# Patient Record
Sex: Female | Born: 1950 | Race: Black or African American | Hispanic: No | Marital: Single | Smoking: Never smoker
Health system: Southern US, Community
[De-identification: ages and names within clinical notes are randomized; demographics above are authoritative.]

## PROBLEM LIST (undated history)

## (undated) DIAGNOSIS — I10 Essential (primary) hypertension: Secondary | ICD-10-CM

---

## 2012-02-08 ENCOUNTER — Emergency Department (HOSPITAL_COMMUNITY)
Admission: EM | Admit: 2012-02-08 | Discharge: 2012-02-08 | Disposition: A | Discharge status: Home / Self Care | Payer: BC Managed Care – PPO | Source: Home / Self Care | Attending: Emergency Medicine | Admitting: Emergency Medicine

## 2012-02-08 ENCOUNTER — Emergency Department (INDEPENDENT_AMBULATORY_CARE_PROVIDER_SITE_OTHER): Payer: BC Managed Care – PPO

## 2012-02-08 ENCOUNTER — Encounter (HOSPITAL_COMMUNITY): Payer: Self-pay | Admitting: Emergency Medicine

## 2012-02-08 DIAGNOSIS — M109 Gout, unspecified: Secondary | ICD-10-CM

## 2012-02-08 HISTORY — DX: Essential (primary) hypertension: I10

## 2012-02-08 LAB — URIC ACID: Uric Acid, Serum: 9.3 mg/dL — ABNORMAL HIGH (ref 2.4–7.0)

## 2012-02-08 MED ORDER — PREDNISONE 20 MG PO TABS: 20.0000 mg | ORAL_TABLET | Freq: Two times a day (BID) | ORAL | Status: AC

## 2012-02-08 MED ORDER — COLCHICINE 0.6 MG PO TABS: ORAL_TABLET | ORAL | Status: AC

## 2012-02-08 NOTE — ED Provider Notes (Signed)
Chief Complaint  Patient presents with  . Foot Pain    History of Present Illness:   Breanna Tanner is a 62 year old female with a two-day history of pain in the MTP joint of the right great toe. She denies any injury. She's had no fever or chills. She has no history of pain like this before or any other kind of arthritis. She has no history of gout, but there is a family history of gout. She also is taking lisinopril/hydrochlorothiazide.  Review of Systems:  Other than noted above, the patient denies any of the following symptoms: Systemic:  No fevers, chills, sweats, or aches.  No fatigue or tiredness. Musculoskeletal:  No joint pain, arthritis, bursitis, swelling, back pain, or neck pain. Neurological:  No muscular weakness, paresthesias, headache, or trouble with speech or coordination.  No dizziness.  PMFSH:  Past medical history, family history, social history, meds, and allergies were reviewed.  Physical Exam:   Vital signs:  BP 147/86  Pulse 77  Temp 97.4 F (36.3 C) (Oral)  Resp 18  SpO2 98% Gen:  Alert and oriented times 3.  In no distress. Musculoskeletal: There is swelling, redness, heat, and tenderness to palpation and movement over the MTP joint of the right great toe.  Otherwise, all joints had a full a ROM with no swelling, bruising or deformity.  No edema, pulses full. Extremities were warm and pink.  Capillary refill was brisk.  Skin:  Clear, warm and dry.  No rash. Neuro:  Alert and oriented times 3.  Muscle strength was normal.  Sensation was intact to light touch.   Radiology:  Dg Foot Complete Right  02/08/2012  *RADIOLOGY REPORT*  Clinical Data: Right foot pain and right great toe pain - swelling.  RIGHT FOOT COMPLETE - 3+ VIEW  Comparison: None  Findings: There is no evidence of fracture, subluxation or dislocation.  The Lisfranc joints are intact.  Mild to moderate degenerative changes are noted at the first and second MTP joints. No suspicious focal bony  lesions or erosive changes are identified.  IMPRESSION: No evidence of acute abnormality.  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   Original Report Authenticated By: Harmon Pier, M.D.    I reviewed the images independently and personally and concur with the radiologist's findings.  Results for orders placed during the hospital encounter of 02/08/12  URIC ACID      Component Value Range   Uric Acid, Serum 9.3 (*) 2.4 - 7.0 mg/dL   Assessment:  The encounter diagnosis was Gout attack.  Plan:   1.  The following meds were prescribed:   New Prescriptions   COLCHICINE 0.6 MG TABLET    Take 2 now and 1 in 1 hour.  May repeat dose once daily.  For gout attack.   PREDNISONE (DELTASONE) 20 MG TABLET    Take 1 tablet (20 mg total) by mouth 2 (two) times daily.   2.  The patient was instructed in symptomatic care, including rest and activity, elevation, application of ice and compression.  Appropriate handouts were given. 3.  The patient was told to return if becoming worse in any way, if no better in 3 or 4 days, and given some red flag symptoms that would indicate earlier return.   4.  The patient was told to follow up with her primary care physician with regard to her elevated uric acid level.    Reuben Likes, MD 02/08/12 [phone]

## 2012-02-08 NOTE — ED Notes (Signed)
Pt c/o right greater toe pain since yesterday Sx include: pain that radiates up to leg when walking Denies: inj/trauma, fevers, vomiting, nauseas, diarrhea No hx of gout  She is alert w/no signs of acute distress.

## 2013-05-25 ENCOUNTER — Other Ambulatory Visit: Payer: Self-pay

## 2013-05-25 ENCOUNTER — Other Ambulatory Visit (HOSPITAL_COMMUNITY)
Admission: RE | Admit: 2013-05-25 | Discharge: 2013-05-25 | Disposition: A | Discharge status: Home / Self Care | Payer: BC Managed Care – PPO | Source: Ambulatory Visit | Attending: Family Medicine | Admitting: Family Medicine

## 2013-05-25 DIAGNOSIS — Z0000 Encounter for general adult medical examination without abnormal findings: Secondary | ICD-10-CM | POA: Insufficient documentation

## 2013-06-10 ENCOUNTER — Other Ambulatory Visit: Payer: Self-pay | Admitting: Family Medicine

## 2013-06-10 DIAGNOSIS — R42 Dizziness and giddiness: Secondary | ICD-10-CM

## 2013-06-11 ENCOUNTER — Ambulatory Visit
Admission: RE | Admit: 2013-06-11 | Discharge: 2013-06-11 | Disposition: A | Discharge status: Home / Self Care | Payer: BC Managed Care – PPO | Source: Ambulatory Visit | Attending: Family Medicine | Admitting: Family Medicine

## 2013-06-11 DIAGNOSIS — R42 Dizziness and giddiness: Secondary | ICD-10-CM

## 2013-10-08 ENCOUNTER — Other Ambulatory Visit: Payer: Self-pay | Admitting: Family Medicine

## 2013-10-08 DIAGNOSIS — Z1231 Encounter for screening mammogram for malignant neoplasm of breast: Secondary | ICD-10-CM

## 2013-10-27 ENCOUNTER — Ambulatory Visit: Payer: BC Managed Care – PPO

## 2014-08-31 ENCOUNTER — Other Ambulatory Visit: Payer: Self-pay

## 2014-08-31 DIAGNOSIS — Z1231 Encounter for screening mammogram for malignant neoplasm of breast: Secondary | ICD-10-CM

## 2014-09-01 ENCOUNTER — Ambulatory Visit
Admission: RE | Admit: 2014-09-01 | Discharge: 2014-09-01 | Disposition: A | Discharge status: Home / Self Care | Payer: PRIVATE HEALTH INSURANCE | Source: Ambulatory Visit

## 2014-09-01 DIAGNOSIS — Z1231 Encounter for screening mammogram for malignant neoplasm of breast: Secondary | ICD-10-CM

## 2015-09-11 ENCOUNTER — Other Ambulatory Visit: Payer: Self-pay | Admitting: Family Medicine

## 2015-09-11 DIAGNOSIS — I6529 Occlusion and stenosis of unspecified carotid artery: Secondary | ICD-10-CM

## 2015-09-15 ENCOUNTER — Other Ambulatory Visit: Payer: PRIVATE HEALTH INSURANCE

## 2015-10-31 ENCOUNTER — Other Ambulatory Visit: Payer: Self-pay | Admitting: Family Medicine

## 2015-10-31 DIAGNOSIS — Z1231 Encounter for screening mammogram for malignant neoplasm of breast: Secondary | ICD-10-CM

## 2015-11-09 ENCOUNTER — Ambulatory Visit: Payer: PRIVATE HEALTH INSURANCE

## 2015-11-17 ENCOUNTER — Ambulatory Visit: Payer: PRIVATE HEALTH INSURANCE

## 2015-12-27 ENCOUNTER — Ambulatory Visit: Payer: PRIVATE HEALTH INSURANCE

## 2015-12-29 ENCOUNTER — Ambulatory Visit
Admission: RE | Admit: 2015-12-29 | Discharge: 2015-12-29 | Disposition: A | Discharge status: Home / Self Care | Payer: BLUE CROSS/BLUE SHIELD | Source: Ambulatory Visit | Attending: Family Medicine | Admitting: Family Medicine

## 2015-12-29 DIAGNOSIS — Z1231 Encounter for screening mammogram for malignant neoplasm of breast: Secondary | ICD-10-CM

## 2016-01-26 DIAGNOSIS — Z79899 Other long term (current) drug therapy: Secondary | ICD-10-CM | POA: Diagnosis not present

## 2016-02-15 DIAGNOSIS — I10 Essential (primary) hypertension: Secondary | ICD-10-CM | POA: Diagnosis not present

## 2016-02-15 DIAGNOSIS — N183 Chronic kidney disease, stage 3 (moderate): Secondary | ICD-10-CM | POA: Diagnosis not present

## 2016-02-15 DIAGNOSIS — E785 Hyperlipidemia, unspecified: Secondary | ICD-10-CM | POA: Diagnosis not present

## 2016-03-04 DIAGNOSIS — Q438 Other specified congenital malformations of intestine: Secondary | ICD-10-CM | POA: Diagnosis not present

## 2016-03-04 DIAGNOSIS — D126 Benign neoplasm of colon, unspecified: Secondary | ICD-10-CM | POA: Diagnosis not present

## 2016-03-04 DIAGNOSIS — K644 Residual hemorrhoidal skin tags: Secondary | ICD-10-CM | POA: Diagnosis not present

## 2016-03-04 DIAGNOSIS — Z800 Family history of malignant neoplasm of digestive organs: Secondary | ICD-10-CM | POA: Diagnosis not present

## 2016-03-04 DIAGNOSIS — Z1211 Encounter for screening for malignant neoplasm of colon: Secondary | ICD-10-CM | POA: Diagnosis not present

## 2016-03-04 DIAGNOSIS — K648 Other hemorrhoids: Secondary | ICD-10-CM | POA: Diagnosis not present

## 2016-03-07 DIAGNOSIS — Z1211 Encounter for screening for malignant neoplasm of colon: Secondary | ICD-10-CM | POA: Diagnosis not present

## 2016-03-07 DIAGNOSIS — D126 Benign neoplasm of colon, unspecified: Secondary | ICD-10-CM | POA: Diagnosis not present

## 2016-04-18 DIAGNOSIS — R7989 Other specified abnormal findings of blood chemistry: Secondary | ICD-10-CM | POA: Diagnosis not present

## 2016-04-18 DIAGNOSIS — I10 Essential (primary) hypertension: Secondary | ICD-10-CM | POA: Diagnosis not present

## 2016-04-25 DIAGNOSIS — K5900 Constipation, unspecified: Secondary | ICD-10-CM | POA: Diagnosis not present

## 2016-04-25 DIAGNOSIS — Z800 Family history of malignant neoplasm of digestive organs: Secondary | ICD-10-CM | POA: Diagnosis not present

## 2016-04-25 DIAGNOSIS — Z86010 Personal history of colonic polyps: Secondary | ICD-10-CM | POA: Diagnosis not present

## 2016-04-25 DIAGNOSIS — Z8371 Family history of colonic polyps: Secondary | ICD-10-CM | POA: Diagnosis not present

## 2016-09-02 DIAGNOSIS — M1A09X0 Idiopathic chronic gout, multiple sites, without tophus (tophi): Secondary | ICD-10-CM | POA: Diagnosis not present

## 2016-09-02 DIAGNOSIS — Z6832 Body mass index (BMI) 32.0-32.9, adult: Secondary | ICD-10-CM | POA: Diagnosis not present

## 2016-09-02 DIAGNOSIS — M2550 Pain in unspecified joint: Secondary | ICD-10-CM | POA: Diagnosis not present

## 2016-09-02 DIAGNOSIS — E669 Obesity, unspecified: Secondary | ICD-10-CM | POA: Diagnosis not present

## 2016-09-02 DIAGNOSIS — Z79899 Other long term (current) drug therapy: Secondary | ICD-10-CM | POA: Diagnosis not present

## 2016-09-19 DIAGNOSIS — R5383 Other fatigue: Secondary | ICD-10-CM | POA: Diagnosis not present

## 2016-09-19 DIAGNOSIS — I10 Essential (primary) hypertension: Secondary | ICD-10-CM | POA: Diagnosis not present

## 2016-09-19 DIAGNOSIS — E785 Hyperlipidemia, unspecified: Secondary | ICD-10-CM | POA: Diagnosis not present

## 2016-09-19 DIAGNOSIS — R634 Abnormal weight loss: Secondary | ICD-10-CM | POA: Diagnosis not present

## 2016-09-19 DIAGNOSIS — Z0000 Encounter for general adult medical examination without abnormal findings: Secondary | ICD-10-CM | POA: Diagnosis not present

## 2016-09-19 DIAGNOSIS — R195 Other fecal abnormalities: Secondary | ICD-10-CM | POA: Diagnosis not present

## 2016-09-19 DIAGNOSIS — R430 Anosmia: Secondary | ICD-10-CM | POA: Diagnosis not present

## 2016-09-19 DIAGNOSIS — Z800 Family history of malignant neoplasm of digestive organs: Secondary | ICD-10-CM | POA: Diagnosis not present

## 2016-10-17 DIAGNOSIS — K5900 Constipation, unspecified: Secondary | ICD-10-CM | POA: Diagnosis not present

## 2016-10-17 DIAGNOSIS — K9289 Other specified diseases of the digestive system: Secondary | ICD-10-CM | POA: Diagnosis not present

## 2016-10-17 DIAGNOSIS — R430 Anosmia: Secondary | ICD-10-CM | POA: Diagnosis not present

## 2016-12-06 ENCOUNTER — Other Ambulatory Visit: Payer: Self-pay | Admitting: Family Medicine

## 2016-12-06 DIAGNOSIS — Z1231 Encounter for screening mammogram for malignant neoplasm of breast: Secondary | ICD-10-CM

## 2017-01-03 ENCOUNTER — Ambulatory Visit
Admission: RE | Admit: 2017-01-03 | Discharge: 2017-01-03 | Disposition: A | Discharge status: Home / Self Care | Payer: Medicare Other | Source: Ambulatory Visit | Attending: Family Medicine | Admitting: Family Medicine

## 2017-01-03 DIAGNOSIS — Z1231 Encounter for screening mammogram for malignant neoplasm of breast: Secondary | ICD-10-CM | POA: Diagnosis not present

## 2017-03-05 DIAGNOSIS — M1A09X0 Idiopathic chronic gout, multiple sites, without tophus (tophi): Secondary | ICD-10-CM | POA: Diagnosis not present

## 2017-03-05 DIAGNOSIS — Z6832 Body mass index (BMI) 32.0-32.9, adult: Secondary | ICD-10-CM | POA: Diagnosis not present

## 2017-03-05 DIAGNOSIS — Z79899 Other long term (current) drug therapy: Secondary | ICD-10-CM | POA: Diagnosis not present

## 2017-03-05 DIAGNOSIS — E669 Obesity, unspecified: Secondary | ICD-10-CM | POA: Diagnosis not present

## 2017-03-05 DIAGNOSIS — M2550 Pain in unspecified joint: Secondary | ICD-10-CM | POA: Diagnosis not present

## 2017-03-18 DIAGNOSIS — H43813 Vitreous degeneration, bilateral: Secondary | ICD-10-CM | POA: Diagnosis not present

## 2017-03-18 DIAGNOSIS — H35363 Drusen (degenerative) of macula, bilateral: Secondary | ICD-10-CM | POA: Diagnosis not present

## 2017-03-18 DIAGNOSIS — H5347 Heteronymous bilateral field defects: Secondary | ICD-10-CM | POA: Diagnosis not present

## 2017-03-24 DIAGNOSIS — G43109 Migraine with aura, not intractable, without status migrainosus: Secondary | ICD-10-CM | POA: Diagnosis not present

## 2017-04-17 DIAGNOSIS — E785 Hyperlipidemia, unspecified: Secondary | ICD-10-CM | POA: Diagnosis not present

## 2017-04-17 DIAGNOSIS — M25552 Pain in left hip: Secondary | ICD-10-CM | POA: Diagnosis not present

## 2017-04-17 DIAGNOSIS — N183 Chronic kidney disease, stage 3 (moderate): Secondary | ICD-10-CM | POA: Diagnosis not present

## 2017-04-17 DIAGNOSIS — I10 Essential (primary) hypertension: Secondary | ICD-10-CM | POA: Diagnosis not present

## 2017-05-27 DIAGNOSIS — R7989 Other specified abnormal findings of blood chemistry: Secondary | ICD-10-CM | POA: Diagnosis not present

## 2017-05-29 DIAGNOSIS — I129 Hypertensive chronic kidney disease with stage 1 through stage 4 chronic kidney disease, or unspecified chronic kidney disease: Secondary | ICD-10-CM | POA: Diagnosis not present

## 2017-05-29 DIAGNOSIS — N183 Chronic kidney disease, stage 3 (moderate): Secondary | ICD-10-CM | POA: Diagnosis not present

## 2017-08-07 DIAGNOSIS — H8110 Benign paroxysmal vertigo, unspecified ear: Secondary | ICD-10-CM | POA: Diagnosis not present

## 2017-08-07 DIAGNOSIS — R58 Hemorrhage, not elsewhere classified: Secondary | ICD-10-CM | POA: Diagnosis not present

## 2017-09-29 DIAGNOSIS — Z124 Encounter for screening for malignant neoplasm of cervix: Secondary | ICD-10-CM | POA: Diagnosis not present

## 2017-09-29 DIAGNOSIS — Z0000 Encounter for general adult medical examination without abnormal findings: Secondary | ICD-10-CM | POA: Diagnosis not present

## 2017-09-29 DIAGNOSIS — N183 Chronic kidney disease, stage 3 (moderate): Secondary | ICD-10-CM | POA: Diagnosis not present

## 2017-09-29 DIAGNOSIS — E785 Hyperlipidemia, unspecified: Secondary | ICD-10-CM | POA: Diagnosis not present

## 2017-09-29 DIAGNOSIS — B373 Candidiasis of vulva and vagina: Secondary | ICD-10-CM | POA: Diagnosis not present

## 2017-09-29 DIAGNOSIS — M109 Gout, unspecified: Secondary | ICD-10-CM | POA: Diagnosis not present

## 2017-09-29 DIAGNOSIS — I10 Essential (primary) hypertension: Secondary | ICD-10-CM | POA: Diagnosis not present

## 2017-09-29 DIAGNOSIS — R350 Frequency of micturition: Secondary | ICD-10-CM | POA: Diagnosis not present

## 2017-09-29 DIAGNOSIS — Z113 Encounter for screening for infections with a predominantly sexual mode of transmission: Secondary | ICD-10-CM | POA: Diagnosis not present

## 2017-09-29 DIAGNOSIS — Z8639 Personal history of other endocrine, nutritional and metabolic disease: Secondary | ICD-10-CM | POA: Diagnosis not present

## 2017-10-02 DIAGNOSIS — M2550 Pain in unspecified joint: Secondary | ICD-10-CM | POA: Diagnosis not present

## 2017-10-02 DIAGNOSIS — Z6833 Body mass index (BMI) 33.0-33.9, adult: Secondary | ICD-10-CM | POA: Diagnosis not present

## 2017-10-02 DIAGNOSIS — E669 Obesity, unspecified: Secondary | ICD-10-CM | POA: Diagnosis not present

## 2017-10-02 DIAGNOSIS — M1A09X0 Idiopathic chronic gout, multiple sites, without tophus (tophi): Secondary | ICD-10-CM | POA: Diagnosis not present

## 2017-10-02 DIAGNOSIS — Z79899 Other long term (current) drug therapy: Secondary | ICD-10-CM | POA: Diagnosis not present

## 2017-10-14 DIAGNOSIS — R319 Hematuria, unspecified: Secondary | ICD-10-CM | POA: Diagnosis not present

## 2017-10-14 DIAGNOSIS — N390 Urinary tract infection, site not specified: Secondary | ICD-10-CM | POA: Diagnosis not present

## 2017-10-23 DIAGNOSIS — M109 Gout, unspecified: Secondary | ICD-10-CM | POA: Diagnosis not present

## 2017-10-23 DIAGNOSIS — N183 Chronic kidney disease, stage 3 (moderate): Secondary | ICD-10-CM | POA: Diagnosis not present

## 2017-10-23 DIAGNOSIS — E785 Hyperlipidemia, unspecified: Secondary | ICD-10-CM | POA: Diagnosis not present

## 2017-10-23 DIAGNOSIS — I10 Essential (primary) hypertension: Secondary | ICD-10-CM | POA: Diagnosis not present

## 2017-11-24 ENCOUNTER — Other Ambulatory Visit: Payer: Self-pay | Admitting: Family Medicine

## 2017-11-24 DIAGNOSIS — Z1231 Encounter for screening mammogram for malignant neoplasm of breast: Secondary | ICD-10-CM

## 2017-12-01 DIAGNOSIS — N898 Other specified noninflammatory disorders of vagina: Secondary | ICD-10-CM | POA: Diagnosis not present

## 2018-01-05 ENCOUNTER — Ambulatory Visit: Payer: 59

## 2018-01-05 DIAGNOSIS — B349 Viral infection, unspecified: Secondary | ICD-10-CM | POA: Diagnosis not present

## 2018-05-18 ENCOUNTER — Other Ambulatory Visit: Payer: Self-pay

## 2018-05-18 ENCOUNTER — Ambulatory Visit
Admission: RE | Admit: 2018-05-18 | Discharge: 2018-05-18 | Disposition: A | Discharge status: Home / Self Care | Payer: Medicare Other | Source: Ambulatory Visit | Attending: Family Medicine | Admitting: Family Medicine

## 2018-05-18 ENCOUNTER — Other Ambulatory Visit: Payer: Self-pay | Admitting: Family Medicine

## 2018-05-18 ENCOUNTER — Other Ambulatory Visit: Payer: 59

## 2018-05-18 DIAGNOSIS — T189XXA Foreign body of alimentary tract, part unspecified, initial encounter: Secondary | ICD-10-CM

## 2018-05-18 DIAGNOSIS — T17900A Unspecified foreign body in respiratory tract, part unspecified causing asphyxiation, initial encounter: Secondary | ICD-10-CM

## 2018-05-18 DIAGNOSIS — T17908A Unspecified foreign body in respiratory tract, part unspecified causing other injury, initial encounter: Secondary | ICD-10-CM

## 2018-10-06 ENCOUNTER — Other Ambulatory Visit: Payer: Self-pay

## 2018-10-06 ENCOUNTER — Other Ambulatory Visit: Payer: Self-pay | Admitting: Obstetrics and Gynecology

## 2018-10-06 ENCOUNTER — Other Ambulatory Visit (HOSPITAL_COMMUNITY)
Admission: RE | Admit: 2018-10-06 | Discharge: 2018-10-06 | Disposition: A | Discharge status: Home / Self Care | Payer: Medicare Other | Source: Ambulatory Visit | Attending: Obstetrics and Gynecology | Admitting: Obstetrics and Gynecology

## 2018-10-06 DIAGNOSIS — E2839 Other primary ovarian failure: Secondary | ICD-10-CM

## 2018-10-06 DIAGNOSIS — Z124 Encounter for screening for malignant neoplasm of cervix: Secondary | ICD-10-CM | POA: Insufficient documentation

## 2018-10-06 DIAGNOSIS — Z1151 Encounter for screening for human papillomavirus (HPV): Secondary | ICD-10-CM | POA: Insufficient documentation

## 2018-10-11 LAB — CYTOLOGY - PAP
Diagnosis: NEGATIVE
High risk HPV: NEGATIVE
Molecular Disclaimer: 56
Molecular Disclaimer: DETECTED
Molecular Disclaimer: NORMAL

## 2018-12-25 ENCOUNTER — Ambulatory Visit
Admission: RE | Admit: 2018-12-25 | Discharge: 2018-12-25 | Disposition: A | Discharge status: Home / Self Care | Payer: Medicare Other | Source: Ambulatory Visit | Attending: Family Medicine | Admitting: Family Medicine

## 2018-12-25 ENCOUNTER — Other Ambulatory Visit: Payer: Self-pay

## 2018-12-25 ENCOUNTER — Ambulatory Visit
Admission: RE | Admit: 2018-12-25 | Discharge: 2018-12-25 | Disposition: A | Discharge status: Home / Self Care | Payer: Medicare Other | Source: Ambulatory Visit | Attending: Obstetrics and Gynecology | Admitting: Obstetrics and Gynecology

## 2018-12-25 DIAGNOSIS — E2839 Other primary ovarian failure: Secondary | ICD-10-CM

## 2018-12-25 DIAGNOSIS — Z1231 Encounter for screening mammogram for malignant neoplasm of breast: Secondary | ICD-10-CM

## 2018-12-28 ENCOUNTER — Other Ambulatory Visit: Payer: Self-pay | Admitting: Family Medicine

## 2018-12-28 ENCOUNTER — Other Ambulatory Visit: Payer: Self-pay | Admitting: Obstetrics and Gynecology

## 2018-12-28 DIAGNOSIS — R928 Other abnormal and inconclusive findings on diagnostic imaging of breast: Secondary | ICD-10-CM

## 2019-01-08 ENCOUNTER — Other Ambulatory Visit: Payer: Self-pay | Admitting: Obstetrics and Gynecology

## 2019-01-08 ENCOUNTER — Ambulatory Visit
Admission: RE | Admit: 2019-01-08 | Discharge: 2019-01-08 | Disposition: A | Discharge status: Home / Self Care | Payer: PRIVATE HEALTH INSURANCE | Source: Ambulatory Visit | Attending: Family Medicine | Admitting: Family Medicine

## 2019-01-08 ENCOUNTER — Other Ambulatory Visit: Payer: Self-pay

## 2019-01-08 DIAGNOSIS — R928 Other abnormal and inconclusive findings on diagnostic imaging of breast: Secondary | ICD-10-CM

## 2019-01-08 DIAGNOSIS — N6320 Unspecified lump in the left breast, unspecified quadrant: Secondary | ICD-10-CM

## 2019-01-13 ENCOUNTER — Other Ambulatory Visit: Payer: Self-pay

## 2019-01-13 ENCOUNTER — Ambulatory Visit
Admission: RE | Admit: 2019-01-13 | Discharge: 2019-01-13 | Disposition: A | Discharge status: Home / Self Care | Payer: PRIVATE HEALTH INSURANCE | Source: Ambulatory Visit | Attending: Obstetrics and Gynecology | Admitting: Obstetrics and Gynecology

## 2019-01-13 ENCOUNTER — Other Ambulatory Visit (HOSPITAL_COMMUNITY)
Admission: RE | Admit: 2019-01-13 | Discharge: 2019-01-13 | Disposition: A | Discharge status: Home / Self Care | Payer: PRIVATE HEALTH INSURANCE | Source: Ambulatory Visit | Attending: Obstetrics and Gynecology | Admitting: Obstetrics and Gynecology

## 2019-01-13 ENCOUNTER — Other Ambulatory Visit: Payer: Self-pay | Admitting: Radiology

## 2019-01-13 DIAGNOSIS — N6320 Unspecified lump in the left breast, unspecified quadrant: Secondary | ICD-10-CM

## 2019-01-19 LAB — SURGICAL PATHOLOGY

## 2020-05-30 ENCOUNTER — Other Ambulatory Visit: Payer: Self-pay | Admitting: Nurse Practitioner

## 2020-05-30 DIAGNOSIS — Z1231 Encounter for screening mammogram for malignant neoplasm of breast: Secondary | ICD-10-CM

## 2020-08-04 DIAGNOSIS — C8290 Follicular lymphoma, unspecified, unspecified site: Secondary | ICD-10-CM | POA: Diagnosis not present

## 2020-08-04 DIAGNOSIS — C8339 Diffuse large B-cell lymphoma, extranodal and solid organ sites: Secondary | ICD-10-CM | POA: Diagnosis not present

## 2020-08-04 DIAGNOSIS — R590 Localized enlarged lymph nodes: Secondary | ICD-10-CM | POA: Diagnosis not present

## 2020-08-07 DIAGNOSIS — Z7189 Other specified counseling: Secondary | ICD-10-CM | POA: Diagnosis not present

## 2020-08-07 DIAGNOSIS — C8290 Follicular lymphoma, unspecified, unspecified site: Secondary | ICD-10-CM | POA: Diagnosis not present

## 2020-08-07 DIAGNOSIS — E538 Deficiency of other specified B group vitamins: Secondary | ICD-10-CM | POA: Diagnosis not present

## 2020-08-07 DIAGNOSIS — C8230 Follicular lymphoma grade IIIa, unspecified site: Secondary | ICD-10-CM | POA: Diagnosis not present

## 2020-08-11 DIAGNOSIS — C8291 Follicular lymphoma, unspecified, lymph nodes of head, face, and neck: Secondary | ICD-10-CM | POA: Diagnosis not present

## 2020-08-11 DIAGNOSIS — R590 Localized enlarged lymph nodes: Secondary | ICD-10-CM | POA: Diagnosis not present

## 2020-09-04 DIAGNOSIS — Z888 Allergy status to other drugs, medicaments and biological substances status: Secondary | ICD-10-CM | POA: Diagnosis not present

## 2020-09-04 DIAGNOSIS — Z79899 Other long term (current) drug therapy: Secondary | ICD-10-CM | POA: Diagnosis not present

## 2020-09-04 DIAGNOSIS — M109 Gout, unspecified: Secondary | ICD-10-CM | POA: Diagnosis not present

## 2020-09-04 DIAGNOSIS — E538 Deficiency of other specified B group vitamins: Secondary | ICD-10-CM | POA: Diagnosis not present

## 2020-09-04 DIAGNOSIS — Z7189 Other specified counseling: Secondary | ICD-10-CM | POA: Diagnosis not present

## 2020-09-04 DIAGNOSIS — Z9842 Cataract extraction status, left eye: Secondary | ICD-10-CM | POA: Diagnosis not present

## 2020-09-04 DIAGNOSIS — C8230 Follicular lymphoma grade IIIa, unspecified site: Secondary | ICD-10-CM | POA: Diagnosis not present

## 2020-09-04 DIAGNOSIS — Z9841 Cataract extraction status, right eye: Secondary | ICD-10-CM | POA: Diagnosis not present

## 2020-09-21 DIAGNOSIS — E538 Deficiency of other specified B group vitamins: Secondary | ICD-10-CM | POA: Diagnosis not present

## 2020-09-21 DIAGNOSIS — C8210 Follicular lymphoma grade II, unspecified site: Secondary | ICD-10-CM | POA: Diagnosis not present

## 2020-10-16 DIAGNOSIS — D72819 Decreased white blood cell count, unspecified: Secondary | ICD-10-CM | POA: Diagnosis not present

## 2020-10-16 DIAGNOSIS — C8294 Follicular lymphoma, unspecified, lymph nodes of axilla and upper limb: Secondary | ICD-10-CM | POA: Diagnosis not present

## 2020-10-23 DIAGNOSIS — C8290 Follicular lymphoma, unspecified, unspecified site: Secondary | ICD-10-CM | POA: Diagnosis not present

## 2020-10-26 DIAGNOSIS — C8291 Follicular lymphoma, unspecified, lymph nodes of head, face, and neck: Secondary | ICD-10-CM | POA: Diagnosis not present

## 2020-11-30 DIAGNOSIS — K635 Polyp of colon: Secondary | ICD-10-CM | POA: Diagnosis not present

## 2020-11-30 DIAGNOSIS — D123 Benign neoplasm of transverse colon: Secondary | ICD-10-CM | POA: Diagnosis not present

## 2020-11-30 DIAGNOSIS — Z800 Family history of malignant neoplasm of digestive organs: Secondary | ICD-10-CM | POA: Diagnosis not present

## 2020-11-30 DIAGNOSIS — Z86010 Personal history of colonic polyps: Secondary | ICD-10-CM | POA: Diagnosis not present

## 2020-11-30 DIAGNOSIS — K649 Unspecified hemorrhoids: Secondary | ICD-10-CM | POA: Diagnosis not present

## 2020-12-04 IMAGING — CR ABDOMEN - 1 VIEW
1 series · 1 of 1 positions shown · non-contrast
Comparison: None.

CLINICAL DATA: Swallowed bridge, difficulty swallowing at times

EXAM:
ABDOMEN - 1 VIEW

[t abdomen supine]
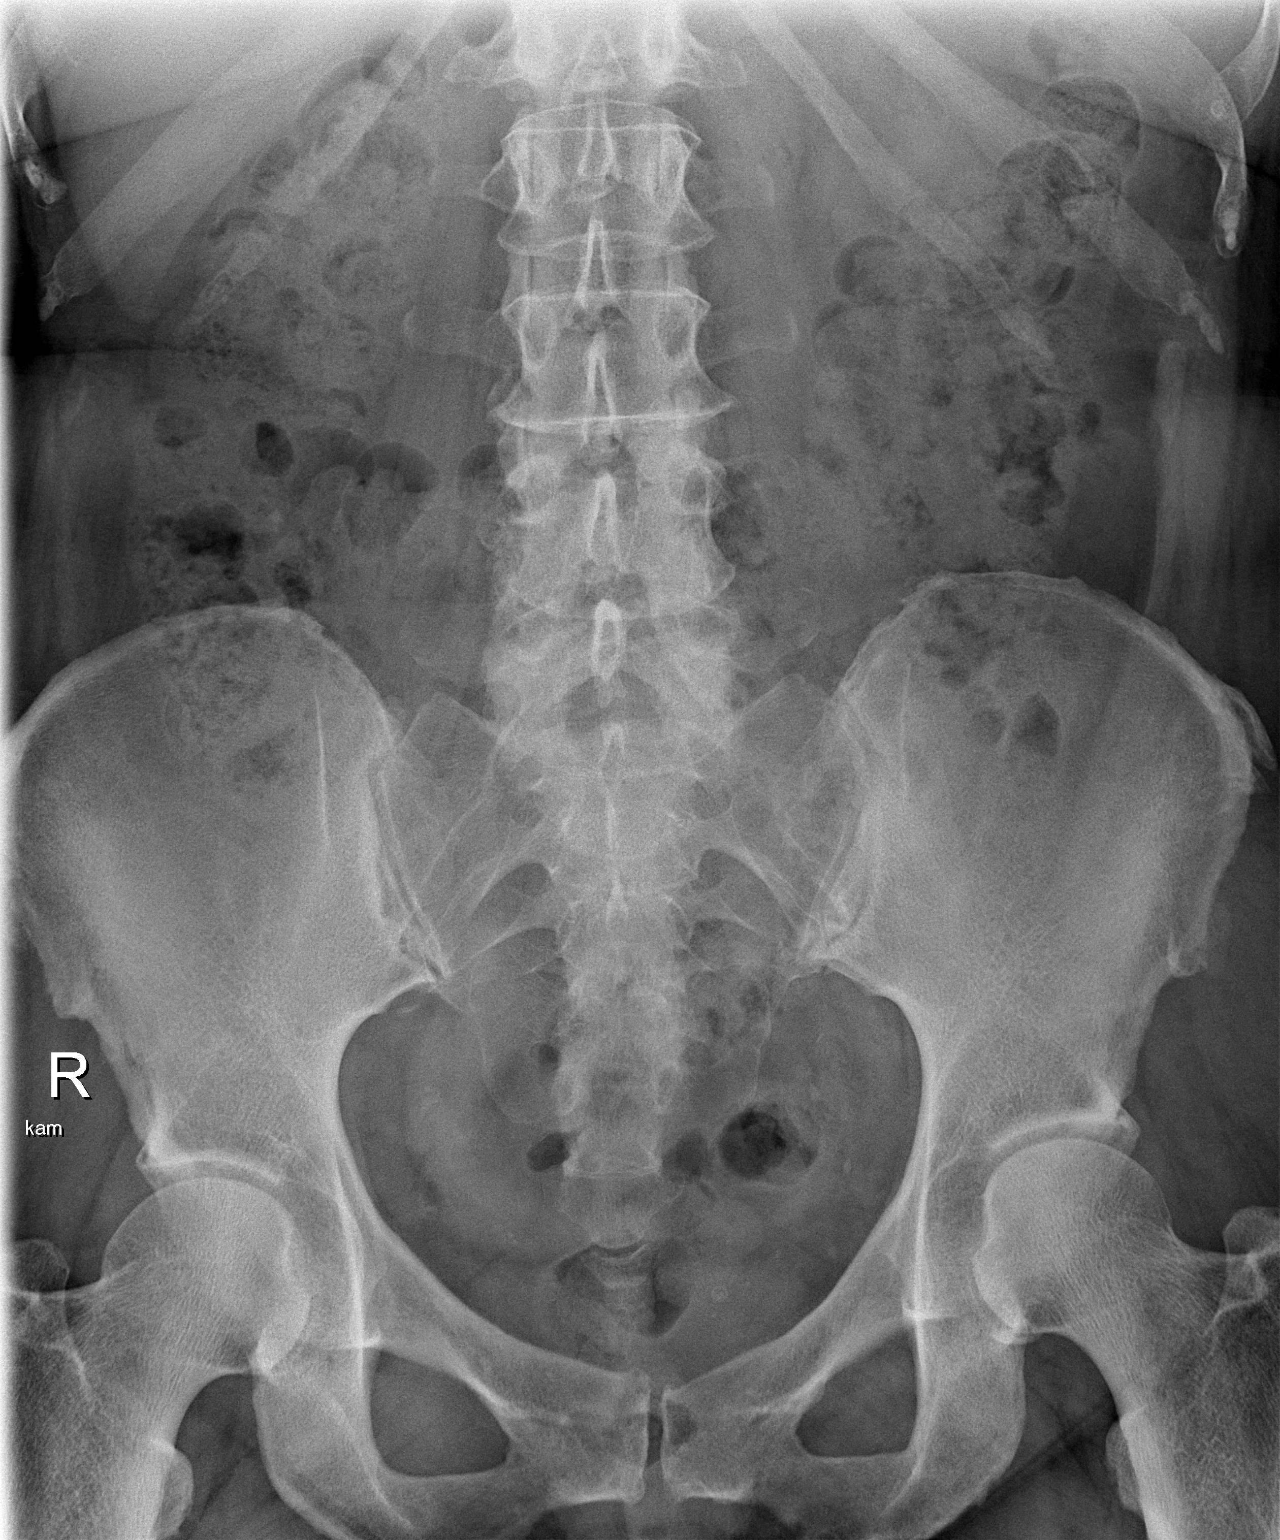

[1 of 1 positions shown; findings below may reference images not displayed]

FINDINGS: The bowel gas pattern is normal. No radio-opaque calculi or other
significant radiographic abnormality are seen.
IMPRESSION: Negative.

## 2020-12-04 IMAGING — CR CHEST - 2 VIEW
2 series · 2 of 2 positions shown · non-contrast
Comparison: None.

CLINICAL DATA: Possibly swallowed a piece of her bridge.

EXAM:
CHEST - 2 VIEW

[w chest pa]
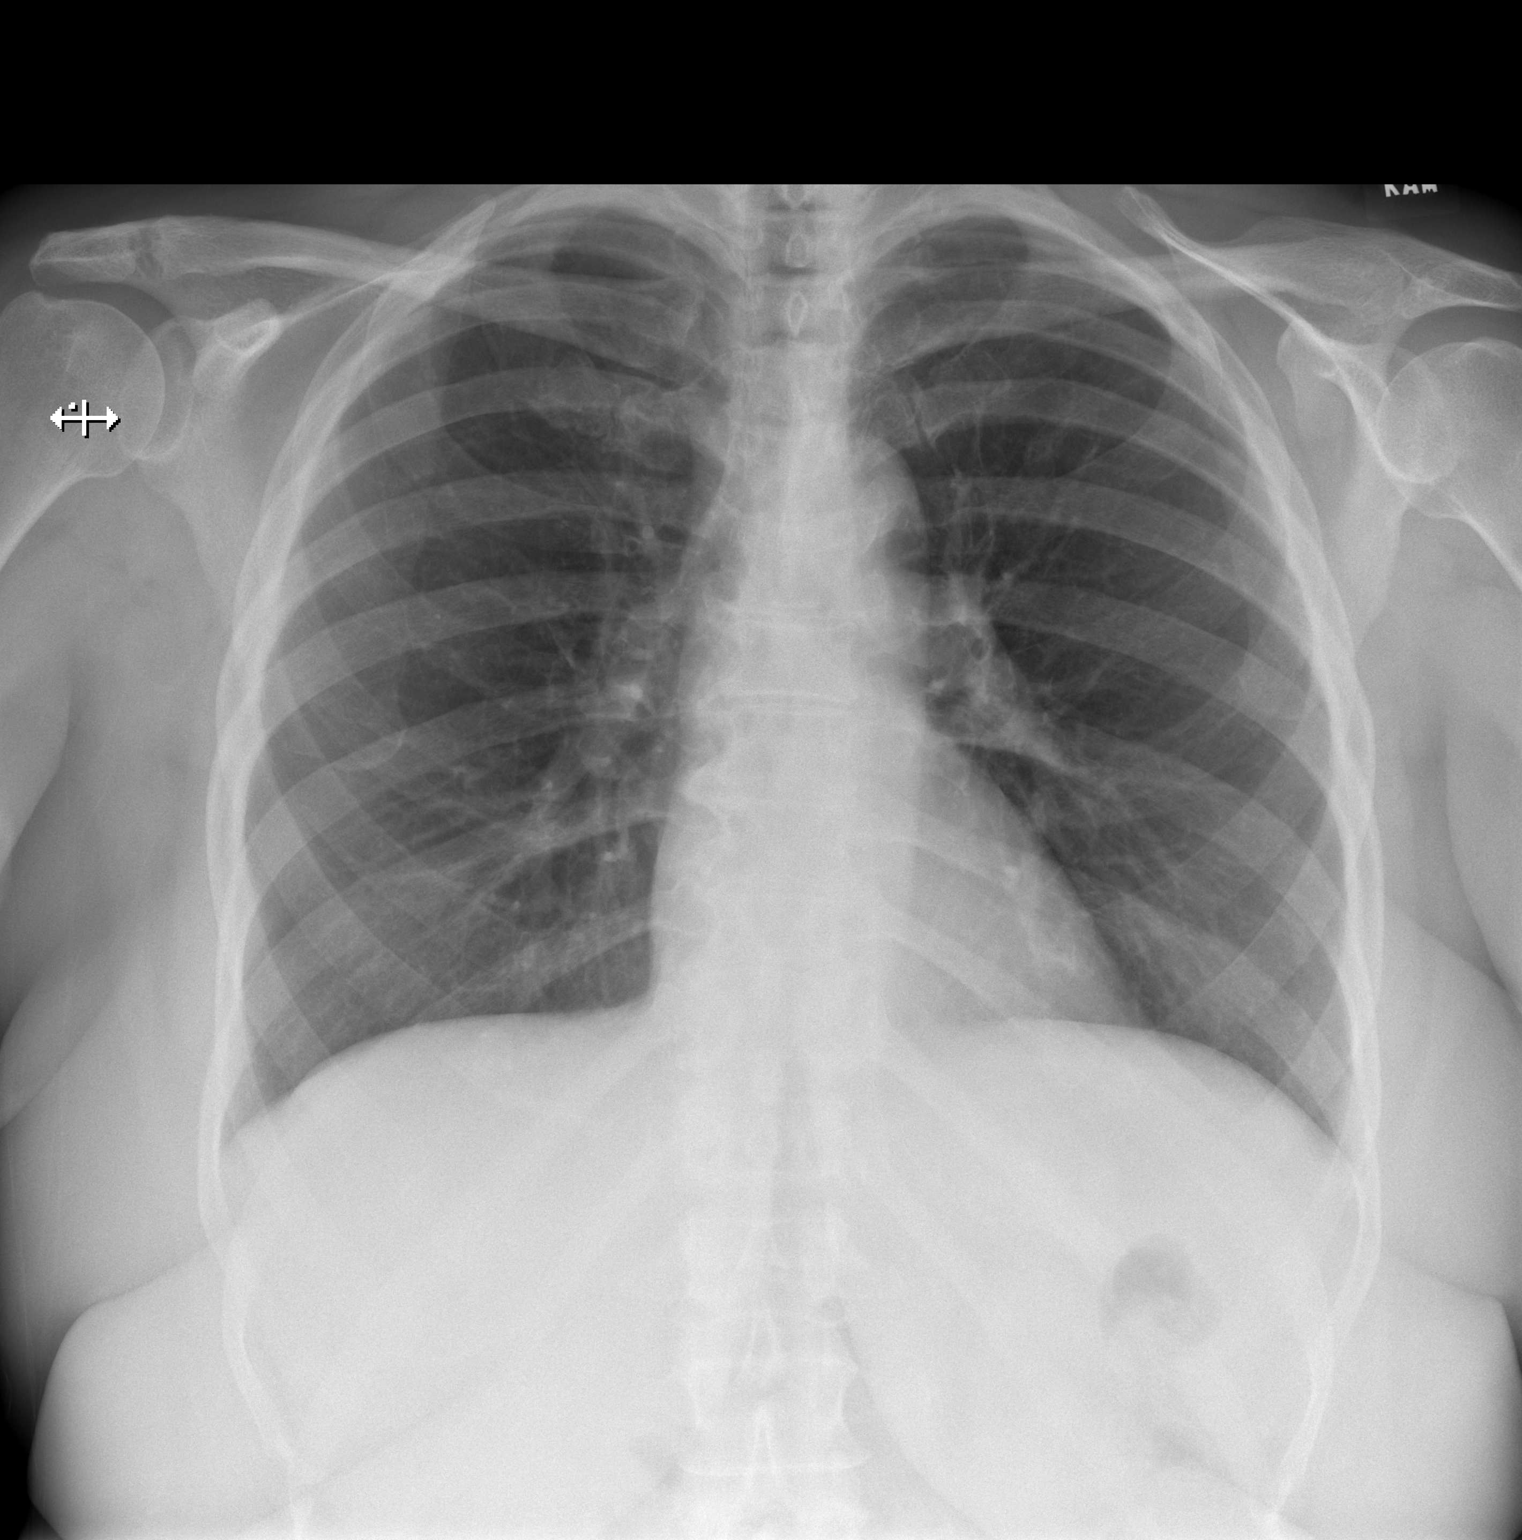

[w chest lat]
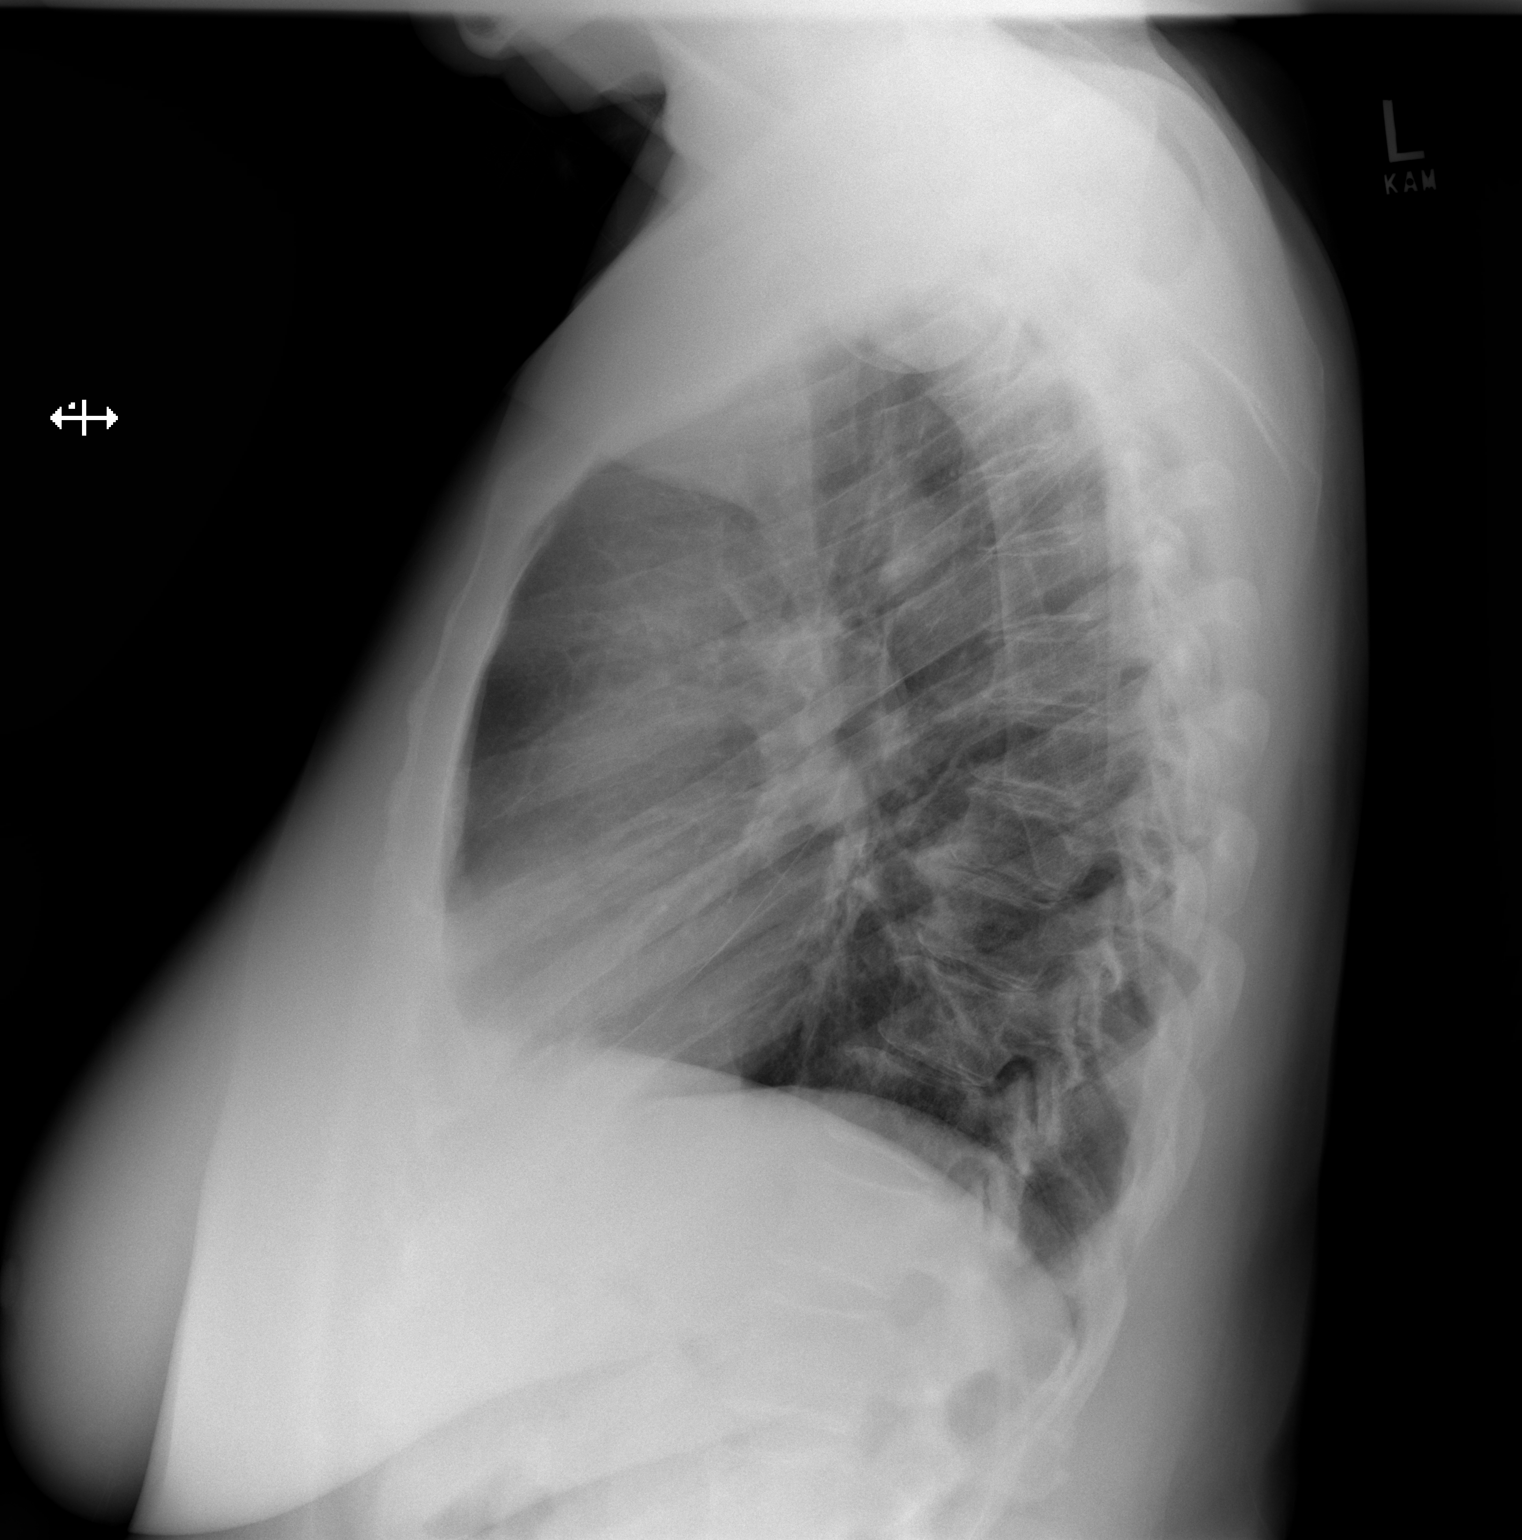

[2 of 2 positions shown; findings below may reference images not displayed]

FINDINGS: The heart size and mediastinal contours are within normal limits.
Normal pulmonary vascularity. No focal consolidation, pleural
effusion, or pneumothorax. No acute osseous abnormality. No
radiopaque foreign body identified.
IMPRESSION: No active cardiopulmonary disease.

## 2021-01-03 DIAGNOSIS — I10 Essential (primary) hypertension: Secondary | ICD-10-CM | POA: Diagnosis not present

## 2021-01-04 DIAGNOSIS — H26493 Other secondary cataract, bilateral: Secondary | ICD-10-CM | POA: Diagnosis not present

## 2021-01-10 DIAGNOSIS — C8210 Follicular lymphoma grade II, unspecified site: Secondary | ICD-10-CM | POA: Diagnosis not present

## 2021-01-10 DIAGNOSIS — Z79899 Other long term (current) drug therapy: Secondary | ICD-10-CM | POA: Diagnosis not present

## 2021-01-10 DIAGNOSIS — N63 Unspecified lump in unspecified breast: Secondary | ICD-10-CM | POA: Diagnosis not present

## 2021-01-10 DIAGNOSIS — E538 Deficiency of other specified B group vitamins: Secondary | ICD-10-CM | POA: Diagnosis not present

## 2021-01-10 DIAGNOSIS — C8290 Follicular lymphoma, unspecified, unspecified site: Secondary | ICD-10-CM | POA: Diagnosis not present

## 2021-01-10 DIAGNOSIS — C8289 Other types of follicular lymphoma, extranodal and solid organ sites: Secondary | ICD-10-CM | POA: Diagnosis not present

## 2021-08-01 IMAGING — US US BREAST BX W LOC DEV 1ST LESION IMG BX SPEC US GUIDE*L*
1 series · 12 of 19 positions shown · non-contrast
Comparison: Previous exam(s).
COMPARISON: Previous exam(s).

Addendum:
CLINICAL DATA: Biopsy of a left breast mass

EXAM:
ULTRASOUND GUIDED LEFT BREAST CORE NEEDLE BIOPSY

[Series 1: us breast bx w loc dev 1st lesion img bx spec us g · 0.07mm/px · 12 of 19 slices shown]
[im 1/19]
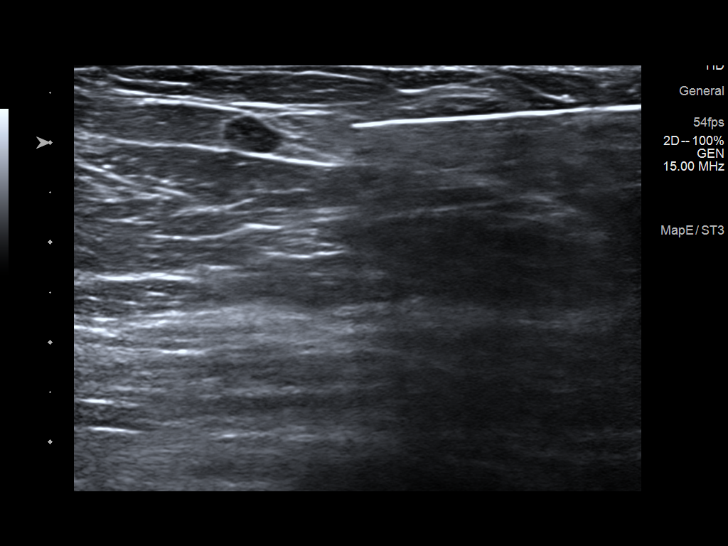
[im 3/19]
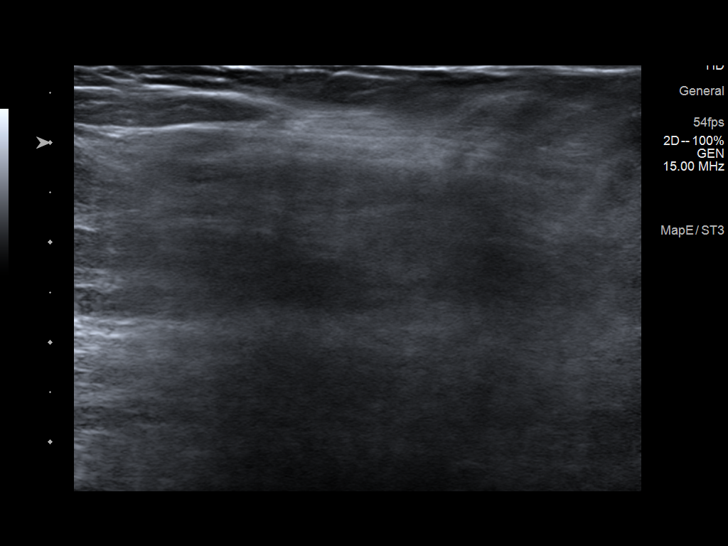
[im 4/19]
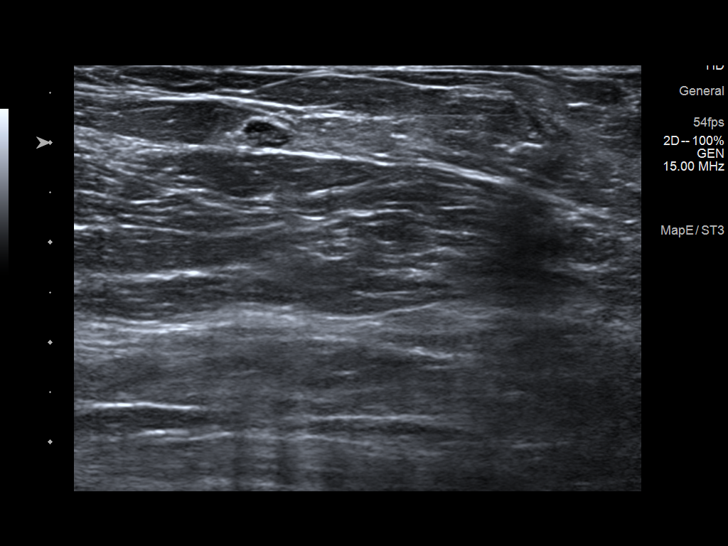
[im 6/19]
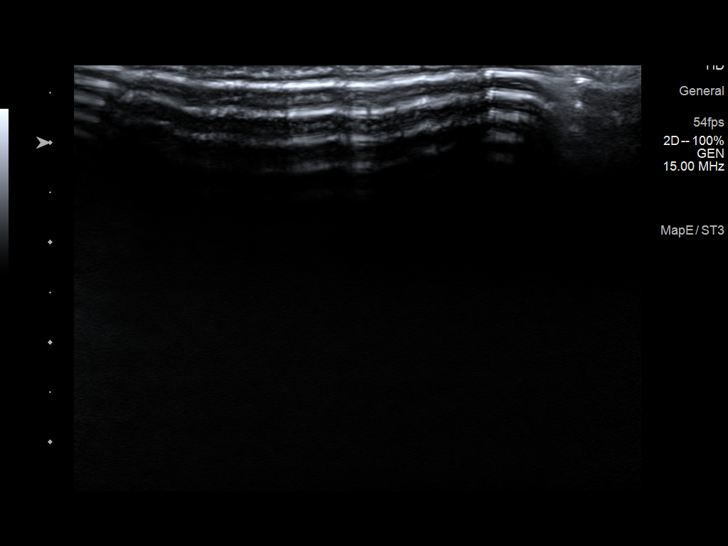
[im 8/19]
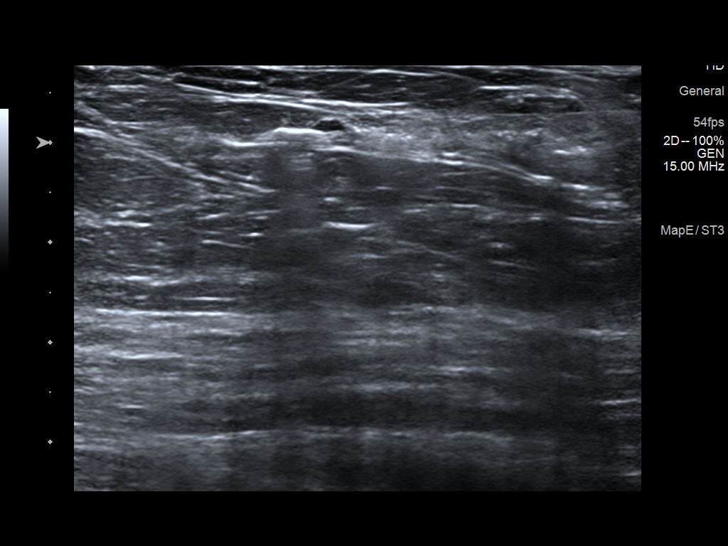
[im 9/19]
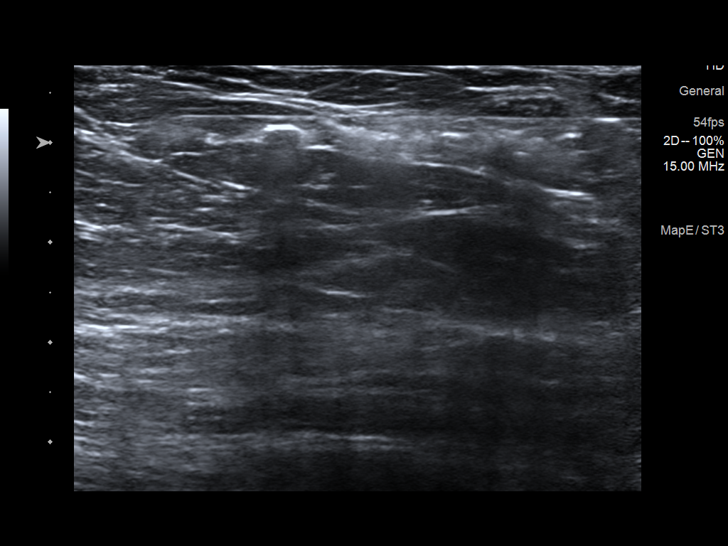
[im 11/19]
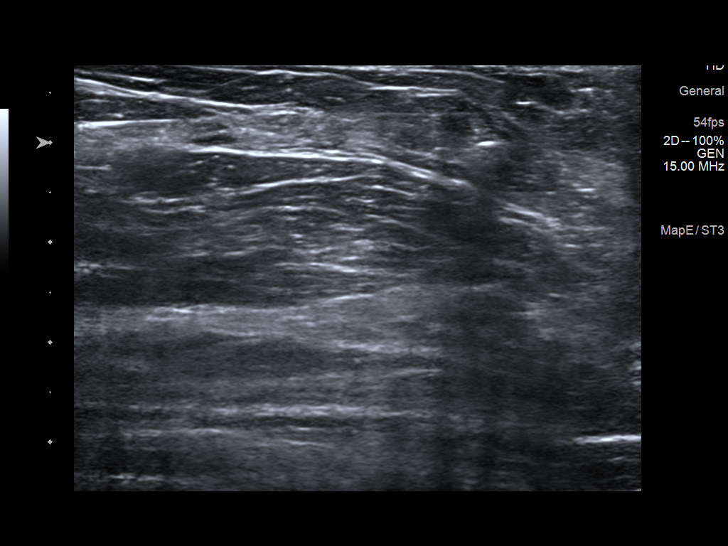
[im 12/19]
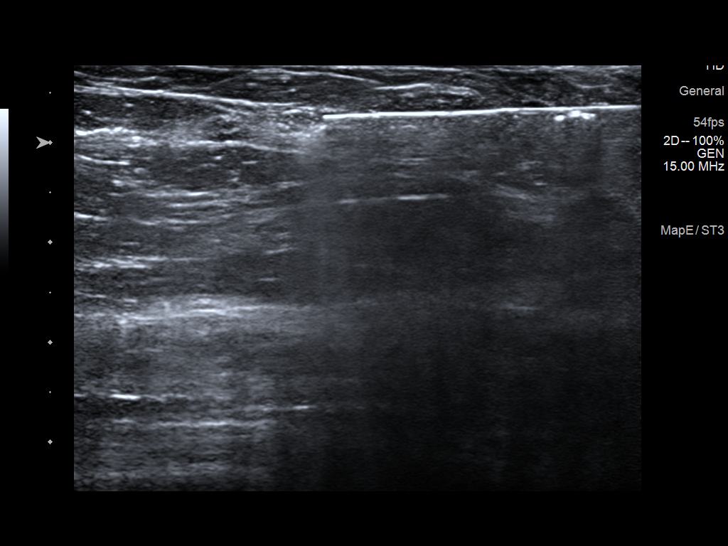
[im 14/19]
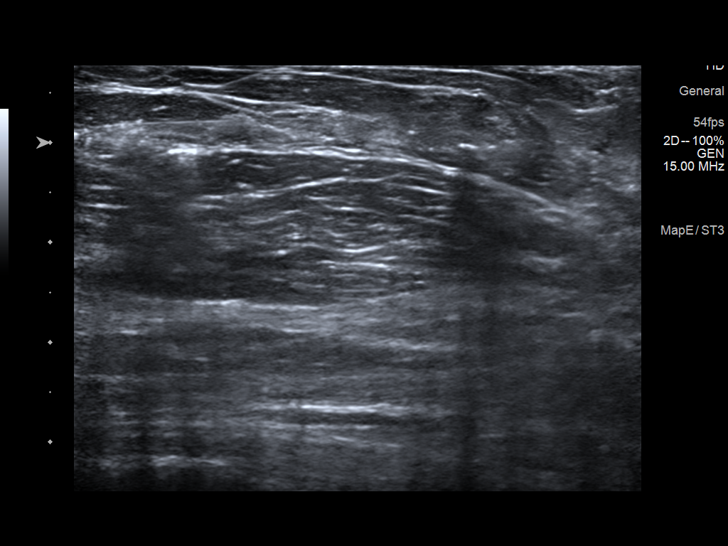
[im 16/19]
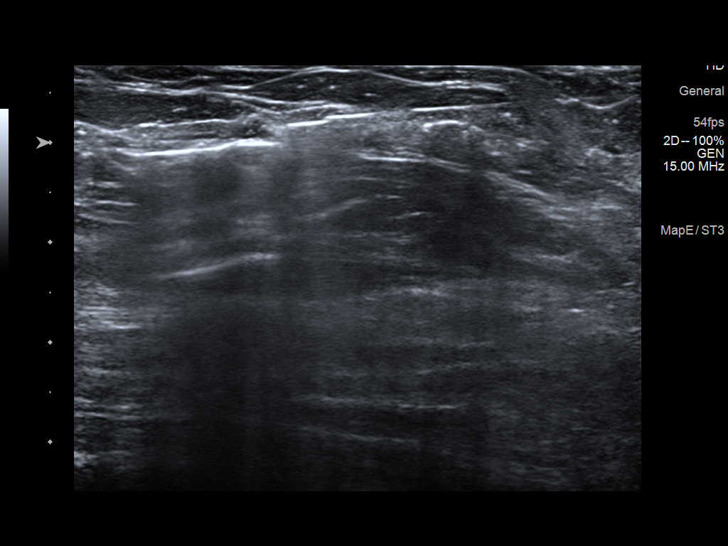
[im 17/19]
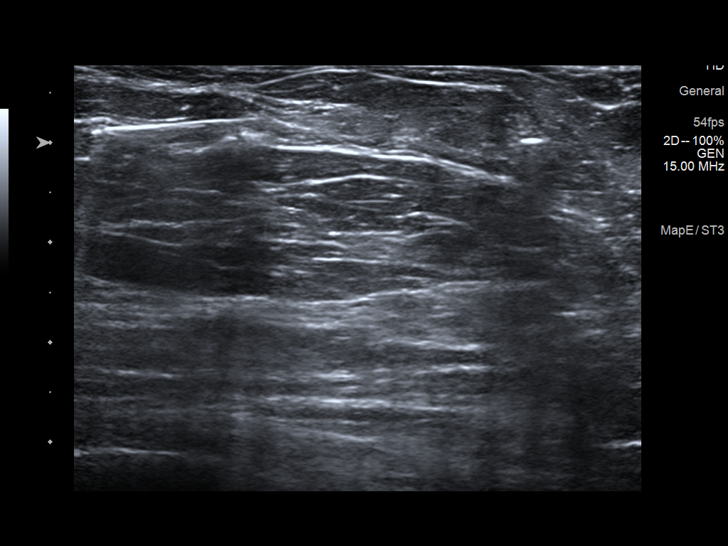
[im 19/19]
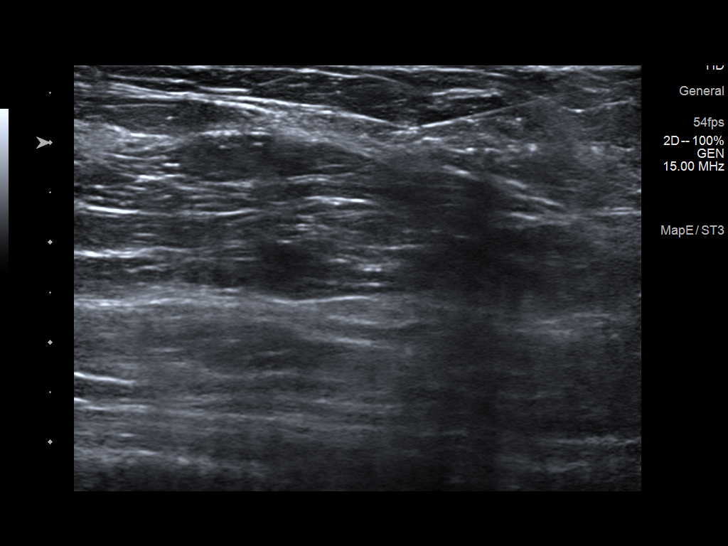

[12 of 19 positions shown; findings below may reference images not displayed]



Lesion quadrant: 3 o'clock

Using sterile technique and 1% Lidocaine as local anesthetic, under
direct ultrasound visualization, a 12 gauge Paulus N device was
used to perform biopsy of a left breast mass at 3 o'clock using a
lateral approach. At the conclusion of the procedure ribbon shaped
tissue marker clip was deployed into the biopsy cavity. Follow up 2
view mammogram was performed and dictated separately.
IMPRESSION: Ultrasound guided biopsy of a left breast mass at 3 o'clock. No
apparent complications.

ADDENDUM:
Pathology revealed FOLLICULAR LYMPHOMA of the Left breast, 3
o'clock. While the overall appearance is low grade, the increased
number of large cells (>15/hpf) in some areas raise the possibility
of a higher grade process. Additional tissue may be helpful in this
regard.

Flow Pathology revealed monoclonal B-cell population identified. The
findings are most consistent with non-Hodgkin B-cell lymphoma and
correlate well with the morphologic features of follicular lymphoma
seen in the lymph node. This was found to be concordant by Dr. Hugo-Cristopher
M[place].

Pathology results were discussed with the patient by Dr. Cai
Maryuri at [facility] on January 19, 2019.

Pathology results were called and faxed to Dr. Maryuri on [facility]

[facility] called the patient on January 18, 2019 and she reported doing
well after the biopsy with tenderness and itching at the site. Post
biopsy instructions and care were reviewed and questions were
answered. The patient was encouraged to call The [REDACTED] of

A medical oncology referral will be arranged by Dr. Maryuri at [HOSPITAL] [HOSPITAL].

Pathology results reported by Wajiha Baladi, RN on 01/21/2019.



Lesion quadrant: 3 o'clock

Using sterile technique and 1% Lidocaine as local anesthetic, under
direct ultrasound visualization, a 12 gauge Paulus N device was
used to perform biopsy of a left breast mass at 3 o'clock using a
lateral approach. At the conclusion of the procedure ribbon shaped
tissue marker clip was deployed into the biopsy cavity. Follow up 2
view mammogram was performed and dictated separately.
IMPRESSION: Ultrasound guided biopsy of a left breast mass at 3 o'clock. No
apparent complications.
